# Patient Record
Sex: Female | Born: 1955 | Race: White | Hispanic: No | Marital: Single
Health system: Southern US, Community
[De-identification: ages and names within clinical notes are randomized; demographics above are authoritative.]

---

## 2006-12-28 ENCOUNTER — Ambulatory Visit: Payer: Self-pay | Admitting: General Surgery

## 2008-11-08 ENCOUNTER — Emergency Department: Payer: Self-pay | Admitting: Emergency Medicine

## 2016-09-10 ENCOUNTER — Other Ambulatory Visit: Payer: Self-pay | Admitting: Physician Assistant

## 2016-09-10 DIAGNOSIS — Z1231 Encounter for screening mammogram for malignant neoplasm of breast: Secondary | ICD-10-CM

## 2016-10-13 ENCOUNTER — Encounter: Payer: Self-pay | Admitting: Radiology

## 2016-10-13 ENCOUNTER — Ambulatory Visit
Admission: RE | Admit: 2016-10-13 | Discharge: 2016-10-13 | Disposition: A | Discharge status: Home / Self Care | Payer: 59 | Source: Ambulatory Visit | Attending: Physician Assistant | Admitting: Physician Assistant

## 2016-10-13 DIAGNOSIS — Z1231 Encounter for screening mammogram for malignant neoplasm of breast: Secondary | ICD-10-CM | POA: Diagnosis present

## 2017-01-25 ENCOUNTER — Other Ambulatory Visit: Payer: Self-pay | Admitting: Student

## 2017-01-25 DIAGNOSIS — B182 Chronic viral hepatitis C: Secondary | ICD-10-CM

## 2017-01-29 ENCOUNTER — Ambulatory Visit
Admission: RE | Admit: 2017-01-29 | Discharge: 2017-01-29 | Disposition: A | Discharge status: Home / Self Care | Payer: 59 | Source: Ambulatory Visit | Attending: Student | Admitting: Student

## 2017-01-29 DIAGNOSIS — N281 Cyst of kidney, acquired: Secondary | ICD-10-CM | POA: Diagnosis not present

## 2017-01-29 DIAGNOSIS — B182 Chronic viral hepatitis C: Secondary | ICD-10-CM | POA: Diagnosis present

## 2017-01-29 DIAGNOSIS — K8020 Calculus of gallbladder without cholecystitis without obstruction: Secondary | ICD-10-CM | POA: Diagnosis not present

## 2017-09-21 ENCOUNTER — Encounter: Admission: RE | Payer: Self-pay | Source: Ambulatory Visit

## 2017-09-21 ENCOUNTER — Ambulatory Visit
Admission: RE | Admit: 2017-09-21 | Payer: Managed Care, Other (non HMO) | Source: Ambulatory Visit | Admitting: Unknown Physician Specialty

## 2017-09-21 SURGERY — ESOPHAGOGASTRODUODENOSCOPY (EGD) WITH PROPOFOL
Anesthesia: General

## 2018-09-21 ENCOUNTER — Other Ambulatory Visit: Payer: Self-pay | Admitting: Physician Assistant

## 2018-09-21 DIAGNOSIS — Z1231 Encounter for screening mammogram for malignant neoplasm of breast: Secondary | ICD-10-CM

## 2019-08-01 ENCOUNTER — Inpatient Hospital Stay: Admission: RE | Admit: 2019-08-01 | Payer: Managed Care, Other (non HMO) | Source: Ambulatory Visit

## 2019-09-14 ENCOUNTER — Ambulatory Visit: Payer: Managed Care, Other (non HMO) | Attending: Internal Medicine

## 2019-09-14 DIAGNOSIS — Z23 Encounter for immunization: Secondary | ICD-10-CM

## 2019-09-14 NOTE — Progress Notes (Signed)
   Covid-19 Vaccination Clinic  Name:  Darlene Trevino    MRN: [id number] DOB: [birth year]  09/14/2019  [name] was observed post Covid-19 immunization for 15 minutes without incident. She was provided with Vaccine Information Sheet and instruction to access the V-Safe system.   [name] was instructed to call 911 with any severe reactions post vaccine: Marland Kitchen Difficulty breathing  . Swelling of face and throat  . A fast heartbeat  . A bad rash all over body  . Dizziness and weakness   Immunizations Administered    Name Date Dose VIS Date Route   Pfizer COVID-19 Vaccine 09/14/2019  8:20 AM 0.3 mL 06/09/2019 Intramuscular   Manufacturer: [employer], [employer]   Lot: [id number]   NDC: [address]-7

## 2019-10-10 ENCOUNTER — Ambulatory Visit: Payer: Managed Care, Other (non HMO) | Attending: Internal Medicine

## 2019-10-10 DIAGNOSIS — Z23 Encounter for immunization: Secondary | ICD-10-CM

## 2019-10-10 NOTE — Progress Notes (Signed)
   Covid-19 Vaccination Clinic  Name:  Darlene Trevino    MRN: [id number] DOB: August 07, 1955  10/10/2019  Ms. [name] was observed post Covid-19 immunization for 15 minutes without incident. She was provided with Vaccine Information Sheet and instruction to access the V-Safe system.   Ms. [name] was instructed to call 911 with any severe reactions post vaccine: Marland Kitchen Difficulty breathing  . Swelling of face and throat  . A fast heartbeat  . A bad rash all over body  . Dizziness and weakness   Immunizations Administered    Name Date Dose VIS Date Route   Pfizer COVID-19 Vaccine 10/10/2019  8:13 AM 0.3 mL 06/09/2019 Intramuscular   Manufacturer: [employer], [employer]   Lot: [id number]   NDC: [address]-7

## 2020-01-12 ENCOUNTER — Other Ambulatory Visit: Payer: Self-pay | Admitting: Internal Medicine

## 2020-01-12 ENCOUNTER — Other Ambulatory Visit: Payer: Self-pay | Admitting: Physician Assistant

## 2020-01-12 DIAGNOSIS — Z1231 Encounter for screening mammogram for malignant neoplasm of breast: Secondary | ICD-10-CM

## 2020-01-31 ENCOUNTER — Ambulatory Visit
Admission: RE | Admit: 2020-01-31 | Discharge: 2020-01-31 | Disposition: A | Discharge status: Home / Self Care | Payer: Managed Care, Other (non HMO) | Source: Ambulatory Visit | Attending: Physician Assistant | Admitting: Physician Assistant

## 2020-01-31 ENCOUNTER — Other Ambulatory Visit: Payer: Self-pay

## 2020-01-31 DIAGNOSIS — Z1231 Encounter for screening mammogram for malignant neoplasm of breast: Secondary | ICD-10-CM | POA: Insufficient documentation

## 2021-03-11 ENCOUNTER — Other Ambulatory Visit: Payer: Self-pay | Admitting: Internal Medicine

## 2021-03-13 LAB — SURGICAL PATHOLOGY

## 2021-04-17 ENCOUNTER — Other Ambulatory Visit: Payer: Self-pay | Admitting: Physician Assistant

## 2021-04-17 DIAGNOSIS — Z1231 Encounter for screening mammogram for malignant neoplasm of breast: Secondary | ICD-10-CM

## 2021-10-21 ENCOUNTER — Other Ambulatory Visit: Payer: Self-pay | Admitting: Physician Assistant

## 2021-10-21 DIAGNOSIS — Z1231 Encounter for screening mammogram for malignant neoplasm of breast: Secondary | ICD-10-CM

## 2021-11-25 ENCOUNTER — Ambulatory Visit
Admission: RE | Admit: 2021-11-25 | Discharge: 2021-11-25 | Disposition: A | Discharge status: Home / Self Care | Payer: Managed Care, Other (non HMO) | Source: Ambulatory Visit | Attending: Physician Assistant | Admitting: Physician Assistant

## 2021-11-25 DIAGNOSIS — Z1231 Encounter for screening mammogram for malignant neoplasm of breast: Secondary | ICD-10-CM | POA: Insufficient documentation

## 2022-10-18 IMAGING — MG MM DIGITAL SCREENING BILAT W/ TOMO AND CAD
8 series · 9 of 24 positions shown · non-contrast
Comparison: Previous exam(s).

CLINICAL DATA: Screening.

EXAM:
DIGITAL SCREENING BILATERAL MAMMOGRAM WITH TOMOSYNTHESIS AND CAD
TECHNIQUE: Bilateral screening digital craniocaudal and mediolateral oblique
mammograms were obtained. Bilateral screening digital breast
tomosynthesis was performed. The images were evaluated with
computer-aided detection.

[L CC synth-2D]
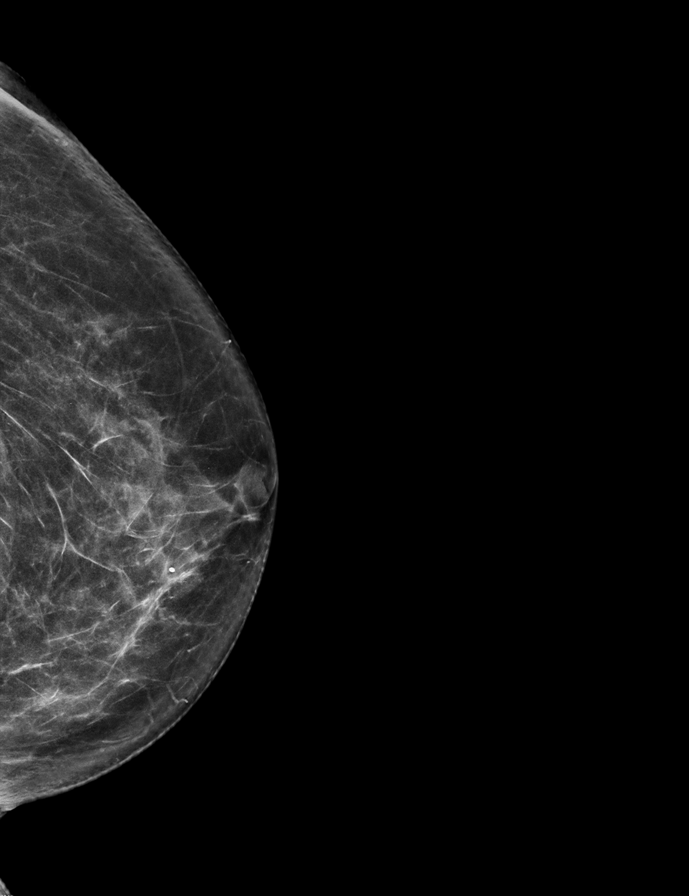

[R MLO synth-2D]
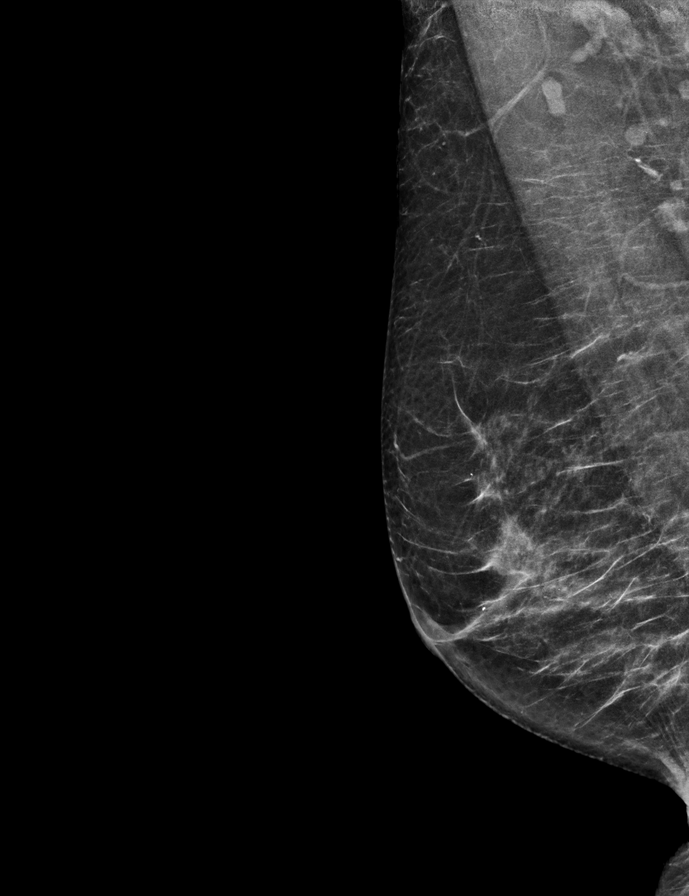

[R CC synth-2D]
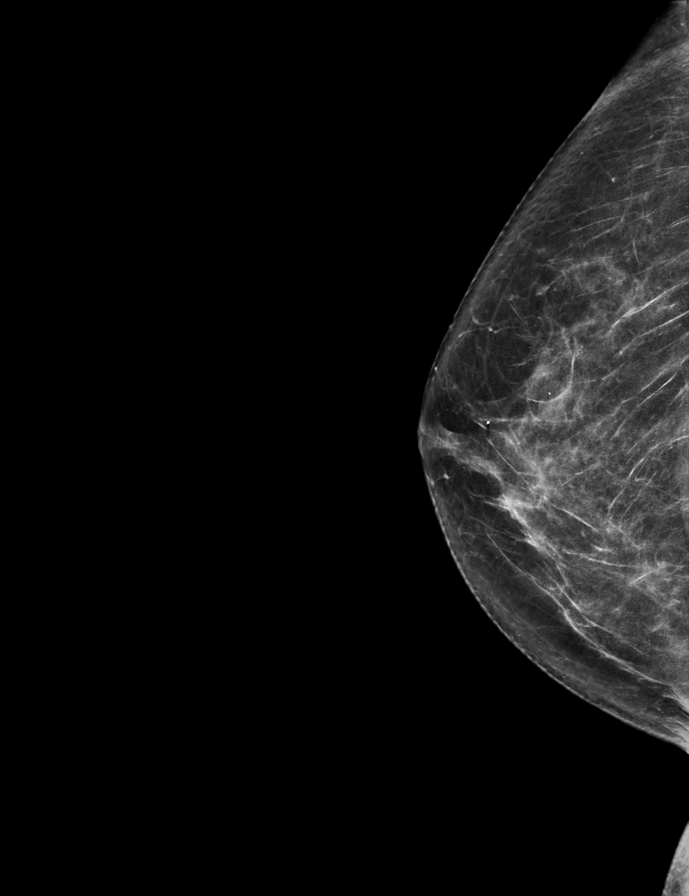

[L MLO synth-2D]
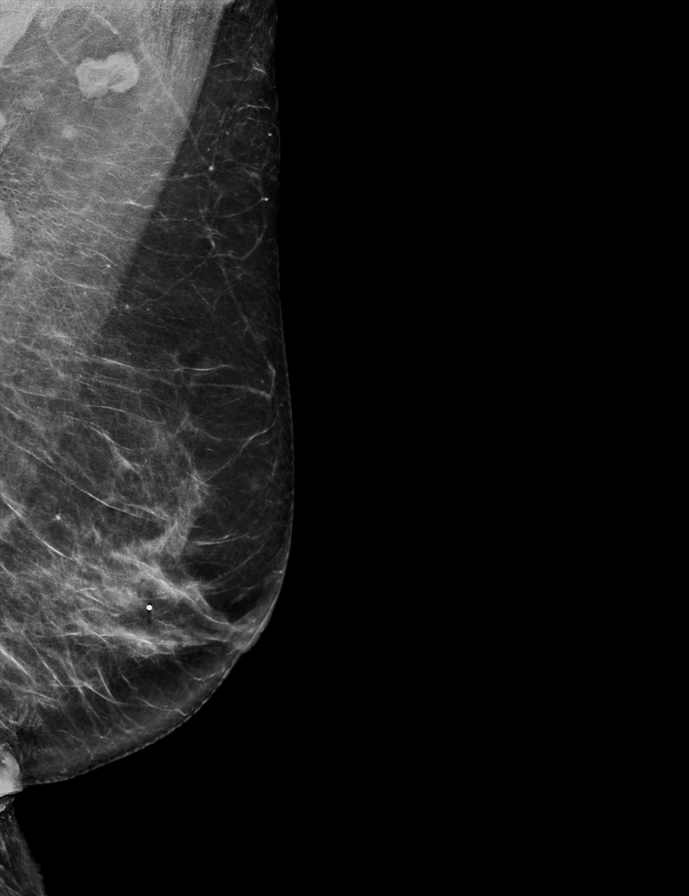

[L MLO tomo · 2 of 67 frames shown]
[frame 22/67]
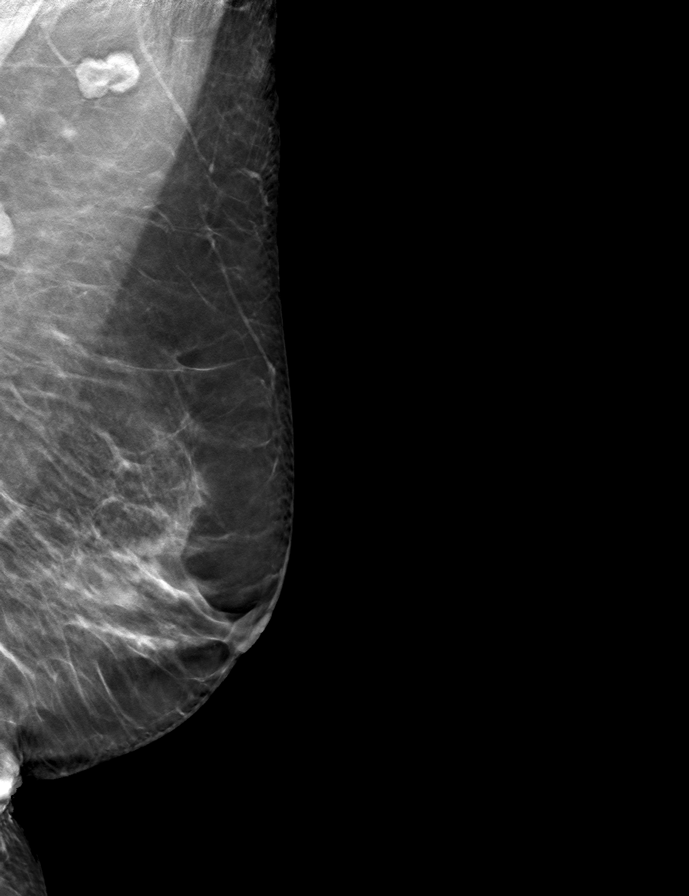
[frame 34/67]
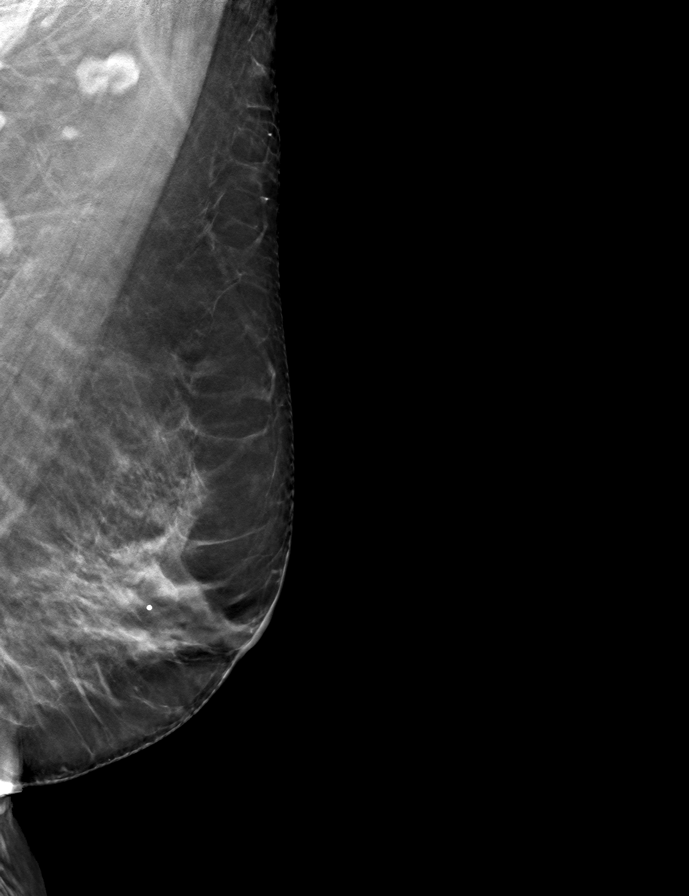

[R CC tomo · tomo slice 33/64.0]
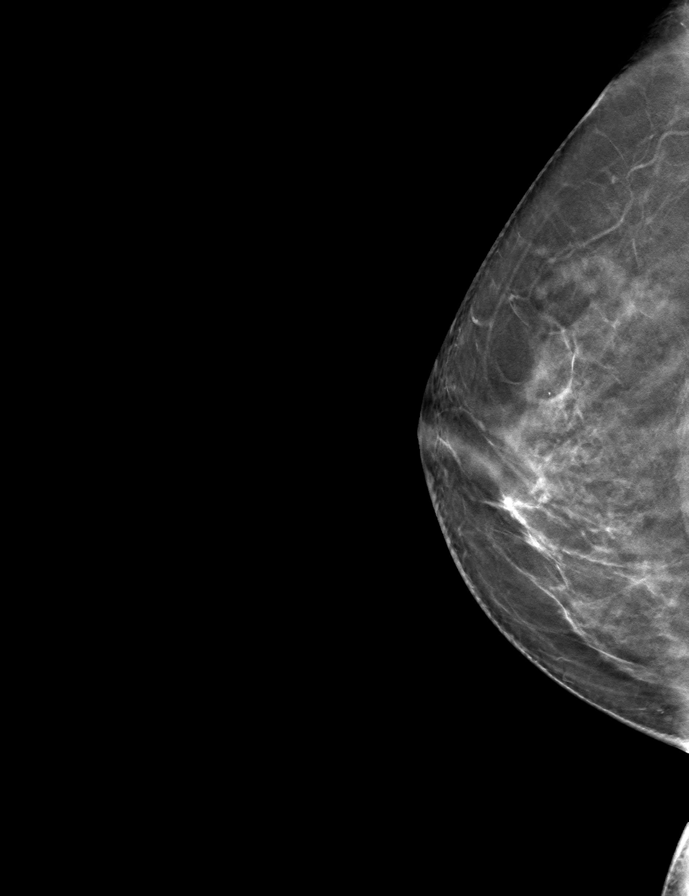

[R MLO tomo · tomo slice 31/62.0]
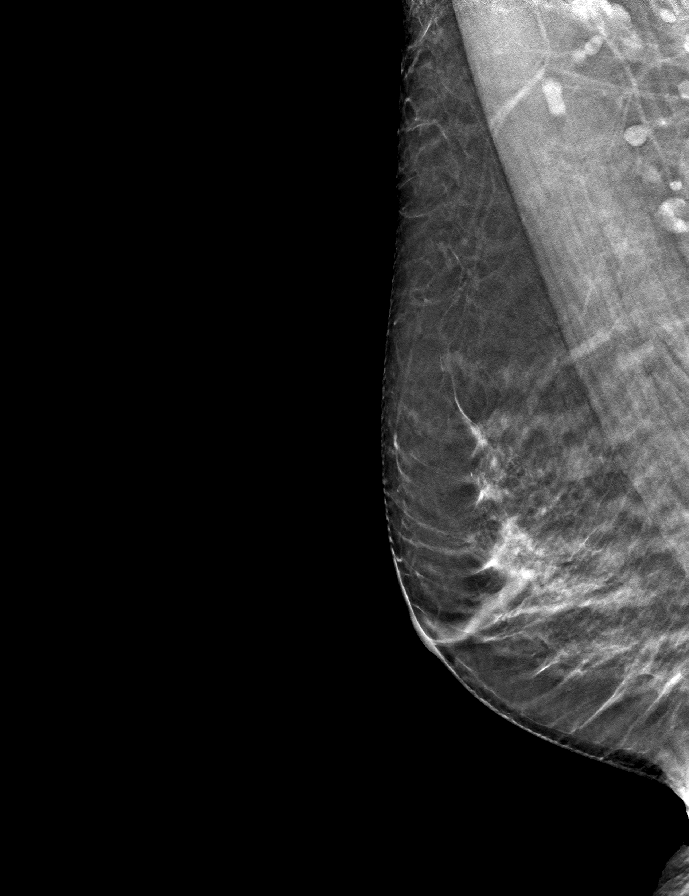

[L CC tomo · tomo slice 33/66.0]
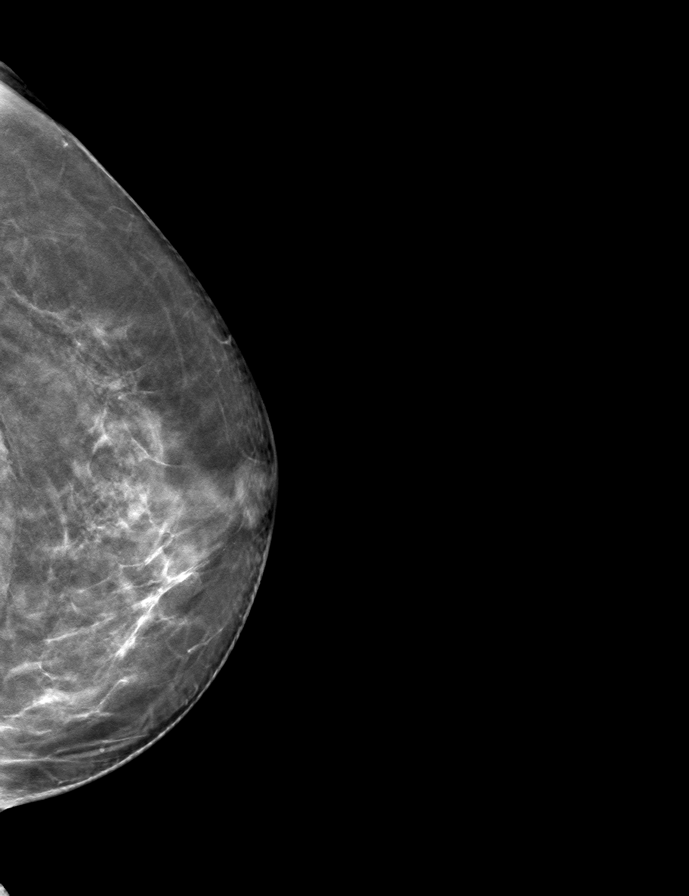

[9 of 24 positions shown; findings below may reference images not displayed]

ACR Breast Density Category c: The breast tissue is heterogeneously
dense, which may obscure small masses.
FINDINGS: There are no findings suspicious for malignancy.
IMPRESSION: No mammographic evidence of malignancy. A result letter of this
screening mammogram will be mailed directly to the patient.

RECOMMENDATION:
Screening mammogram in one year. (Code:[id number])

BI-RADS CATEGORY  1: Negative.

## 2022-10-30 ENCOUNTER — Other Ambulatory Visit
Admission: RE | Admit: 2022-10-30 | Discharge: 2022-10-30 | Disposition: A | Discharge status: Home / Self Care | Payer: Managed Care, Other (non HMO) | Source: Ambulatory Visit | Attending: Student | Admitting: Student

## 2022-10-30 ENCOUNTER — Other Ambulatory Visit: Payer: Self-pay | Admitting: Student

## 2022-10-30 ENCOUNTER — Ambulatory Visit
Admission: RE | Admit: 2022-10-30 | Discharge: 2022-10-30 | Disposition: A | Discharge status: Home / Self Care | Payer: Managed Care, Other (non HMO) | Source: Ambulatory Visit | Attending: Student

## 2022-10-30 DIAGNOSIS — M25471 Effusion, right ankle: Secondary | ICD-10-CM | POA: Diagnosis present

## 2022-10-30 DIAGNOSIS — R7989 Other specified abnormal findings of blood chemistry: Secondary | ICD-10-CM | POA: Insufficient documentation

## 2022-10-30 LAB — D-DIMER, QUANTITATIVE: D-Dimer, Quant: 0.76 ug/mL-FEU — ABNORMAL HIGH (ref 0.00–0.50)

## 2022-11-05 ENCOUNTER — Ambulatory Visit
Admission: RE | Admit: 2022-11-05 | Discharge: 2022-11-05 | Disposition: A | Discharge status: Home / Self Care | Payer: Managed Care, Other (non HMO) | Source: Ambulatory Visit | Attending: Physician Assistant | Admitting: Physician Assistant

## 2022-11-05 ENCOUNTER — Other Ambulatory Visit: Payer: Self-pay | Admitting: Physician Assistant

## 2022-11-05 DIAGNOSIS — R600 Localized edema: Secondary | ICD-10-CM

## 2023-01-19 ENCOUNTER — Other Ambulatory Visit: Payer: Self-pay | Admitting: Physician Assistant

## 2023-01-19 DIAGNOSIS — Z1231 Encounter for screening mammogram for malignant neoplasm of breast: Secondary | ICD-10-CM

## 2023-12-27 ENCOUNTER — Other Ambulatory Visit: Payer: Self-pay | Admitting: Physician Assistant

## 2023-12-27 DIAGNOSIS — Z1231 Encounter for screening mammogram for malignant neoplasm of breast: Secondary | ICD-10-CM
